# Patient Record
Sex: Male | Born: 1970 | Race: White | Hispanic: No | Marital: Single | Smoking: Former smoker
Health system: Southern US, Community
[De-identification: ages and names within clinical notes are randomized; demographics above are authoritative.]

## PROBLEM LIST (undated history)

## (undated) ENCOUNTER — Ambulatory Visit: Admission: EM | Payer: Self-pay

## (undated) DIAGNOSIS — Q539 Undescended testicle, unspecified: Secondary | ICD-10-CM

## (undated) HISTORY — DX: Undescended testicle, unspecified: Q53.9

## (undated) HISTORY — PX: TONSILLECTOMY: SUR1361

## (undated) HISTORY — PX: TESTICLE SURGERY: SHX794

## (undated) HISTORY — PX: WISDOM TOOTH EXTRACTION: SHX21

---

## 2006-10-21 ENCOUNTER — Ambulatory Visit: Payer: Self-pay | Admitting: Internal Medicine

## 2007-04-12 ENCOUNTER — Ambulatory Visit: Payer: Self-pay | Admitting: Urology

## 2007-05-21 ENCOUNTER — Ambulatory Visit: Payer: Self-pay | Admitting: Urology

## 2007-07-28 ENCOUNTER — Ambulatory Visit: Payer: Self-pay | Admitting: Family Medicine

## 2009-11-29 ENCOUNTER — Ambulatory Visit: Payer: Self-pay | Admitting: Internal Medicine

## 2011-02-07 HISTORY — PX: MASS EXCISION: SHX2000

## 2011-05-24 ENCOUNTER — Ambulatory Visit: Payer: Self-pay

## 2011-05-24 LAB — DOT URINE DIP
Blood: NEGATIVE
Glucose,UR: NEGATIVE mg/dL (ref 0–75)
Protein: NEGATIVE
Specific Gravity: 1.01 (ref 1.003–1.030)

## 2011-09-13 ENCOUNTER — Ambulatory Visit: Payer: Self-pay | Admitting: Family Medicine

## 2011-09-13 LAB — COMPREHENSIVE METABOLIC PANEL
Albumin: 4.4 g/dL (ref 3.4–5.0)
Alkaline Phosphatase: 77 U/L (ref 50–136)
BUN: 9 mg/dL (ref 7–18)
Bilirubin,Total: 0.9 mg/dL (ref 0.2–1.0)
Chloride: 103 mmol/L (ref 98–107)
Co2: 29 mmol/L (ref 21–32)
Creatinine: 1.03 mg/dL (ref 0.60–1.30)
EGFR (Non-African Amer.): >60
Osmolality: 278 (ref 275–301)
SGOT(AST): 23 U/L (ref 15–37)
SGPT (ALT): 45 U/L (ref 12–78)
Sodium: 140 mmol/L (ref 136–145)

## 2014-06-01 ENCOUNTER — Ambulatory Visit
Admit: 2014-06-01 | Disposition: A | Discharge status: Home / Self Care | Payer: Self-pay | Attending: Family Medicine | Admitting: Family Medicine

## 2016-07-31 ENCOUNTER — Other Ambulatory Visit: Payer: Self-pay

## 2016-08-01 ENCOUNTER — Ambulatory Visit (INDEPENDENT_AMBULATORY_CARE_PROVIDER_SITE_OTHER): Payer: BLUE CROSS/BLUE SHIELD | Admitting: Surgery

## 2016-08-01 ENCOUNTER — Encounter: Payer: Self-pay | Admitting: Surgery

## 2016-08-01 VITALS — BP 117/73 | HR 70 | Temp 98.7°F | Ht 64.0 in | Wt 123.0 lb

## 2016-08-01 DIAGNOSIS — D171 Benign lipomatous neoplasm of skin and subcutaneous tissue of trunk: Secondary | ICD-10-CM | POA: Diagnosis not present

## 2016-08-01 DIAGNOSIS — L720 Epidermal cyst: Secondary | ICD-10-CM

## 2016-08-01 NOTE — Patient Instructions (Signed)
Today we have seen you for your Lipoma on your Left Hip and also you have a sebaceous cyst on your right side. As long as they continue to not provide you pain or not bother you, you do not need to come back to see [place]. If you begin to have problems or either is causing you pain, please call our office and we will work you in with Dr. Rosana Hoes.   Lipoma A lipoma is a noncancerous (benign) tumor that is made up of fat cells. This is a very common type of soft-tissue growth. Lipomas are usually found under the skin (subcutaneous). They may occur in any tissue of the body that contains fat. Common areas for lipomas to appear include the back, shoulders, buttocks, and thighs. Lipomas grow slowly, and they are usually painless. Most lipomas do not cause problems and do not require treatment. What are the causes? The cause of this condition is not known. What increases the risk? This condition is more likely to develop in:  People who are 97-46 years old.  People who have a family history of lipomas.  What are the signs or symptoms? A lipoma usually appears as a small, round bump under the skin. It may feel soft or rubbery, but the firmness can vary. Most lipomas are not painful. However, a lipoma may become painful if it is located in an area where it pushes on nerves. How is this diagnosed? A lipoma can usually be diagnosed with a physical exam. You may also have tests to confirm the diagnosis and to rule out other conditions. Tests may include:  Imaging tests, such as a CT scan or MRI.  Removal of a tissue sample to be looked at under a microscope (biopsy).  How is this treated? Treatment is not needed for small lipomas that are not causing problems. If a lipoma continues to get bigger or it causes problems, removal is often the best option. Lipomas can also be removed to improve appearance. Removal of a lipoma is usually done with a surgery in which the fatty cells and the surrounding capsule are  removed. Most often, a medicine that numbs the area (local anesthetic) is used for this procedure. Follow these instructions at home:  Keep all follow-up visits as directed by your health care provider. This is important. Contact a health care provider if:  Your lipoma becomes larger or hard.  Your lipoma becomes painful, red, or increasingly swollen. These could be signs of infection or a more serious condition. This information is not intended to replace advice given to you by your health care provider. Make sure you discuss any questions you have with your health care provider. Document Released: 01/13/2002 Document Revised: 07/01/2015 Document Reviewed: 01/19/2014 Elsevier Interactive Patient Education  [employer].

## 2016-08-01 NOTE — Progress Notes (Signed)
Surgical Clinic History and Physical  Referring provider:  Verita Lamb, NP [address] [place], [place] [address]  HISTORY OF PRESENT ILLNESS (HPI):  46 y.o. otherwise healthy male presents for evaluation of Left groin "bulge". 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 He denies any pain associated with the Left groin mass; denies any recent constipation, increased coughing, or straining with urination; and denies fever/chills, CP, or SOB.  PAST MEDICAL HISTORY (PMH):  Past Medical History:  Diagnosis Date  . Undescended testicle      PAST SURGICAL HISTORY ([place]):  Past Surgical History:  Procedure Laterality Date  . MASS EXCISION  2013  . TESTICLE SURGERY Right   . WISDOM TOOTH EXTRACTION       MEDICATIONS:  Prior to Admission medications   Medication Sig Start Date End Date Taking? Authorizing Provider  clotrimazole (MYCELEX) 10 MG troche Take 10 mg by mouth. 07/21/16 08/04/16 Yes [provider]  Multiple Vitamin (MULTI-VITAMINS) TABS Take by mouth.   Yes [provider]     ALLERGIES:  No Known Allergies   SOCIAL HISTORY:  Social History   Social History  . Marital status: Single    Spouse name: N/A  . Number of children: N/A  . Years of education: N/A   Occupational History  . Not on file.   Social History Main Topics  . Smoking status: Former Smoker    Quit date: 08/01/2008  . Smokeless tobacco: Former Systems developer    Quit date: 08/02/1990  . Alcohol use Yes     Comment: 4 cans of beer weekly  . Drug use: No  . Sexual activity: Not on file   Other Topics Concern  . Not on file   Social History Narrative  . No narrative on file    The patient currently resides (home / rehab facility / nursing home): Home  The patient normally is (ambulatory / bedbound) : Ambulatory   FAMILY HISTORY:   Family History  Problem Relation Age of Onset  . Kidney disease Father   . Healthy Father   . Healthy Mother     Otherwise negative/non-contributory.  REVIEW OF SYSTEMS:  Constitutional: denies any other weight loss, fever, chills, or sweats  Eyes: denies any other vision changes, history of eye injury  ENT: denies sore throat, hearing problems  Respiratory: denies shortness of breath, wheezing  Cardiovascular: denies chest pain, palpitations  Gastrointestinal: abdominal pain, N/V, and bowel function as per HPI Musculoskeletal: denies any other joint pains or cramps  Skin: Denies any other rashes or skin discolorations  Neurological: denies any other headache, dizziness, weakness  Psychiatric: Denies any other depression, anxiety   All other review of systems were otherwise negative   VITAL SIGNS:  BP 117/73   Pulse 70   Temp 98.7 F (37.1 C) (Oral)   Ht 5\' 4"  (1.626 m)   Wt 123 lb (55.8 kg)   BMI 21.11 kg/m   PHYSICAL EXAM:  Constitutional:  -- Normal body habitus  -- Awake, alert, and oriented x3  Eyes:  -- Pupils equally round and reactive to light  -- No scleral icterus  Ear, nose, throat:  -- No jugular venous distension -- No nasal drainage, bleeding Pulmonary:  -- No crackles  -- Equal breath sounds bilaterally -- Breathing non-labored at rest Cardiovascular:  -- S1, S2 present  --  No pericardial rubs  Gastrointestinal:  -- Abdomen soft, nontender, nondistended, no guarding/rebound  -- No abdominal masses appreciated, pulsatile or otherwise Musculoskeletal and Integumentary:  -- Wounds or skin discoloration: non-tender mobile 3.5 cm x 2 cm Left ASIS spongy subcutaneous mass without surrounding erythema and a non-tender, firm, mobile Right lateral chest wall mass without erythema or drainage; no inguinal hernias appreciated -- Extremities: B/L UE and LE FROM, hands and feet warm, no edema  Neurologic:  -- Motor function: Intact and symmetric --  Sensation: Intact and symmetric  Labs:  CBC: No results found for: WBC, RBC BMP:  Lab Results  Component Value Date   GLUCOSE 88 09/13/2011   CO2 29 09/13/2011   BUN 9 09/13/2011   CREATININE 1.03 09/13/2011   CALCIUM 9.5 09/13/2011     Imaging studies: No pertinent imaging studies to review   Assessment/Plan:  46 y.o. male with Left hip/LLQ subcutaneous mass suggestive of lipoma and Right lateral chest wall firm subcutaneous mass suggestive of sebaceous/epidermoid cyst, complicated by co-morbidities including former tobacco abuse and Right orchiectomy.   - discussed with patient observation vs elective excision of likely lipoma and/or sebaceous cyst   - patient expresses wishes to continue non-operative management at this time, will call if becomes symptomatic  - return to clinic as needed, advised to call if any questions or concerns  All of the above recommendations were discussed with the patient, and all of patient's questions were answered to his expressed satisfaction.  Thank you for the opportunity to participate in this patient's care.  -- Marilynne Drivers Rosana Hoes, MD, [place]: [facility] - Partnering for exceptional care. Office: [phone]

## 2016-12-12 ENCOUNTER — Emergency Department
Admission: EM | Admit: 2016-12-12 | Discharge: 2016-12-12 | Disposition: A | Discharge status: Home / Self Care | Payer: Worker's Compensation | Attending: Emergency Medicine | Admitting: Emergency Medicine

## 2016-12-12 ENCOUNTER — Other Ambulatory Visit: Payer: Self-pay

## 2016-12-12 ENCOUNTER — Emergency Department: Payer: Worker's Compensation

## 2016-12-12 DIAGNOSIS — Y929 Unspecified place or not applicable: Secondary | ICD-10-CM | POA: Diagnosis not present

## 2016-12-12 DIAGNOSIS — Y9389 Activity, other specified: Secondary | ICD-10-CM | POA: Diagnosis not present

## 2016-12-12 DIAGNOSIS — Z23 Encounter for immunization: Secondary | ICD-10-CM | POA: Diagnosis not present

## 2016-12-12 DIAGNOSIS — Z87891 Personal history of nicotine dependence: Secondary | ICD-10-CM | POA: Insufficient documentation

## 2016-12-12 DIAGNOSIS — Y990 Civilian activity done for income or pay: Secondary | ICD-10-CM | POA: Insufficient documentation

## 2016-12-12 DIAGNOSIS — W228XXA Striking against or struck by other objects, initial encounter: Secondary | ICD-10-CM | POA: Diagnosis not present

## 2016-12-12 DIAGNOSIS — S0993XA Unspecified injury of face, initial encounter: Secondary | ICD-10-CM | POA: Diagnosis present

## 2016-12-12 DIAGNOSIS — S0181XA Laceration without foreign body of other part of head, initial encounter: Secondary | ICD-10-CM | POA: Diagnosis not present

## 2016-12-12 DIAGNOSIS — R6884 Jaw pain: Secondary | ICD-10-CM

## 2016-12-12 MED ORDER — TETANUS-DIPHTH-ACELL PERTUSSIS 5-2.5-18.5 LF-MCG/0.5 IM SUSP
0.5000 mL | Freq: Once | INTRAMUSCULAR | Status: AC
  Administered 2016-12-12: 0.5 mL via INTRAMUSCULAR

## 2016-12-12 MED ORDER — BACITRACIN ZINC 500 UNIT/GM EX OINT
1.0000 "application " | TOPICAL_OINTMENT | Freq: Two times a day (BID) | CUTANEOUS | Status: DC
  Administered 2016-12-12: 1 via TOPICAL
  Filled 2016-12-12: qty 0.9

## 2016-12-12 MED ORDER — TETANUS-DIPHTHERIA TOXOIDS TD 5-2 LFU IM INJ
0.5000 mL | INJECTION | Freq: Once | INTRAMUSCULAR | Status: DC
  Filled 2016-12-12: qty 0.5

## 2016-12-12 MED ORDER — LIDOCAINE HCL (PF) 1 % IJ SOLN
5.0000 mL | Freq: Once | INTRAMUSCULAR | Status: AC
  Administered 2016-12-12: 5 mL via INTRADERMAL
  Filled 2016-12-12: qty 5

## 2016-12-12 MED ORDER — TETANUS-DIPHTH-ACELL PERTUSSIS 5-2.5-18.5 LF-MCG/0.5 IM SUSP
INTRAMUSCULAR | Status: AC
  Administered 2016-12-12: 0.5 mL via INTRAMUSCULAR
  Filled 2016-12-12: qty 0.5

## 2016-12-12 NOTE — ED Provider Notes (Signed)
[facility] Emergency Department Provider Note  ____________________________________________  Time seen: Approximately 9:41 PM  I have reviewed the triage vital signs and the nursing notes.   HISTORY  Chief Complaint Laceration   HPI Edward Ibarra is a 46 y.o. male who presents to the emergency department for evaluation and treatment of the laceration that he sustained under the right mandible while at work. Patient states that he was lowering landing gear on his truck at work and the hand crank must have been wound tightly and when it loosened the crank spun around quickly and hit him under the chin. He denies falling to the ground or loss of consciousness. He does complain of some pain to the right side of the mandible, but denies dental injury/pain or pain with clenching teeth.  Past Medical History:  Diagnosis Date  . Undescended testicle     There are no active problems to display for this patient.   Past Surgical History:  Procedure Laterality Date  . MASS EXCISION  2013  . TESTICLE SURGERY Right   . WISDOM TOOTH EXTRACTION      Prior to Admission medications   Medication Sig Start Date End Date Taking? Authorizing Provider  Multiple Vitamin (MULTI-VITAMINS) TABS Take by mouth.    [provider]    Allergies Patient has no known allergies.  Family History  Problem Relation Age of Onset  . Kidney disease Father   . Healthy Father   . Healthy Mother     Social History Social History   Tobacco Use  . Smoking status: Former Smoker    Last attempt to quit: 08/01/2008    Years since quitting: 8.3  . Smokeless tobacco: Former Systems developer    Quit date: 08/02/1990  Substance Use Topics  . Alcohol use: Yes    Comment: 4 cans of beer weekly  . Drug use: No    Review of Systems  Constitutional: Negative for fever. Respiratory: Negative for cough or shortness of breath.  Musculoskeletal: Negative for myalgias. Positive for  pain in the right mandible. Skin: Positive for laceration to the right mandibular area Neurological: Negative for numbness or paresthesias. ____________________________________________   PHYSICAL EXAM:  VITAL SIGNS: ED Triage Vitals  Enc Vitals Group     BP 12/12/16 2136 140/84     Pulse Rate 12/12/16 2136 82     Resp 12/12/16 2136 20     Temp 12/12/16 2136 98.2 F (36.8 C)     Temp Source 12/12/16 2136 Oral     SpO2 12/12/16 2136 99 %     Weight 12/12/16 2134 120 lb (54.4 kg)     Height 12/12/16 2134 5\' 4"  (1.626 m)     Head Circumference --      Peak Flow --      Pain Score 12/12/16 2133 7     Pain Loc --      Pain Edu? --      Excl. in [place]? --      Constitutional: Well appearing. Eyes: Conjunctivae are clear without discharge or drainage. Nose: No rhinorrhea noted. Mouth/Throat: Airway is patent.  Neck: No stridor. Unrestricted range of motion observed.  Cardiovascular: Capillary refill is <3 seconds.  Respiratory: Respirations are even and unlabored.. Musculoskeletal: Unrestricted range of motion observed. Neurologic: Awake, alert, and oriented x 4.  Skin:  1 cm laceration to the skin over the right lower mandible  ____________________________________________   LABS (all labs ordered are listed, but only abnormal results are  displayed)  Labs Reviewed - No data to display ____________________________________________  EKG  Not indicated ____________________________________________  RADIOLOGY  Mandible images negative for acute bony abnormality per radiology. ____________________________________________   PROCEDURES  Procedure(s) performed: LACERATION REPAIR Performed by: Sherrie George  Consent: Verbal consent obtained.  Consent given by: patient  Prepped and Draped in normal sterile fashion  Wound explored: No foreign bodies identified or removed  Laceration Location: Skin overlying the right mandible  Laceration Length: 1 cm  Anesthesia:  Local   Local anesthetic: lidocaine 1 % without epinephrine  Anesthetic total: 3 ml  Irrigation method: syringe  Amount of cleaning: Standard   Skin closure: 6-0 Prolene   Number of sutures: 3   Technique: Simple interrupted   Patient tolerance: Patient tolerated the procedure well with no immediate complications.    ____________________________________________   INITIAL IMPRESSION / ASSESSMENT AND PLAN / ED COURSE  Edward Ibarra is a 46 y.o. male who presents to the emergency department for treatment and evaluation after sustaining an injury to his right mandible while at work. While in the emergency department tonight, and he was given a TD booster and sutures were inserted to repair the laceration. The patient was instructed to follow-up with his primary care provider or the place of his company's choice to have the sutures removed in 4-5 days. Wound care was discussed and written instructions were also provided upon discharge. Patient was advised to return to emergency department for any concern of infection if he is unable schedule an outpatient appointment.   Pertinent labs & imaging results that were available during my care of the patient were reviewed by me and considered in my medical decision making (see chart for details). ____________________________________________   FINAL CLINICAL IMPRESSION(S) / ED DIAGNOSES  Final diagnoses:  Laceration of skin of chin, initial encounter  Mandible pain    If controlled substance prescribed during this visit, 12 month history viewed on the [facility] prior to issuing an initial prescription for Schedule II or III opiod.   Note:  This document was prepared using Dragon voice recognition software and may include unintentional dictation errors.    Victorino Dike, FNP 12/13/16 0001    Eula Listen, MD 12/18/16 2151

## 2016-12-12 NOTE — ED Triage Notes (Signed)
Pt was at work and metal part hit him in the chin, small lac noted to chin area.

## 2016-12-12 NOTE — Discharge Instructions (Signed)
Do not get the sutured area wet for 24 hours. After 24 hours, shower/bathe as usual and pat the area dry. Change the bandage 2 times per day and apply antibiotic ointment. Leave open to air when at no risk of getting the area dirty, but cover at night before bed. See your PCP or the provider of your company's choice in  5 days for suture removal or sooner for signs or concern of infection.

## 2017-05-04 ENCOUNTER — Emergency Department: Payer: BLUE CROSS/BLUE SHIELD

## 2017-05-04 DIAGNOSIS — Z79899 Other long term (current) drug therapy: Secondary | ICD-10-CM | POA: Insufficient documentation

## 2017-05-04 DIAGNOSIS — R091 Pleurisy: Secondary | ICD-10-CM | POA: Diagnosis not present

## 2017-05-04 DIAGNOSIS — Z87891 Personal history of nicotine dependence: Secondary | ICD-10-CM | POA: Diagnosis not present

## 2017-05-04 DIAGNOSIS — R079 Chest pain, unspecified: Secondary | ICD-10-CM | POA: Diagnosis present

## 2017-05-04 NOTE — ED Triage Notes (Signed)
Patient c/o right chest pain radiating into right back that worsens with movement and deep inspiration

## 2017-05-04 NOTE — ED Notes (Signed)
Patient transported to X-ray 

## 2017-05-05 ENCOUNTER — Emergency Department
Admission: EM | Admit: 2017-05-05 | Discharge: 2017-05-05 | Disposition: A | Discharge status: Home / Self Care | Payer: BLUE CROSS/BLUE SHIELD | Attending: Emergency Medicine | Admitting: Emergency Medicine

## 2017-05-05 DIAGNOSIS — R091 Pleurisy: Secondary | ICD-10-CM

## 2017-05-05 LAB — BASIC METABOLIC PANEL
ANION GAP: 10 (ref 5–15)
BUN: 16 mg/dL (ref 6–20)
CALCIUM: 9.6 mg/dL (ref 8.9–10.3)
CO2: 25 mmol/L (ref 22–32)
Chloride: 105 mmol/L (ref 101–111)
Creatinine, Ser: 0.95 mg/dL (ref 0.61–1.24)
GFR calc Af Amer: >60 mL/min (ref >60)
GLUCOSE: 107 mg/dL — AB (ref 65–99)
POTASSIUM: 4.3 mmol/L (ref 3.5–5.1)
SODIUM: 140 mmol/L (ref 135–145)

## 2017-05-05 LAB — CBC
HEMATOCRIT: 43.1 % (ref 40.0–52.0)
HEMOGLOBIN: 14.9 g/dL (ref 13.0–18.0)
MCH: 29.9 pg (ref 26.0–34.0)
MCHC: 34.6 g/dL (ref 32.0–36.0)
MCV: 86.4 fL (ref 80.0–100.0)
Platelets: 225 10*3/uL (ref 150–440)
RBC: 4.99 MIL/uL (ref 4.40–5.90)
RDW: 13.2 % (ref 11.5–14.5)
WBC: 8.2 10*3/uL (ref 3.8–10.6)

## 2017-05-05 LAB — TROPONIN I: Troponin I: <0.03 ng/mL (ref <0.03)

## 2017-05-05 MED ORDER — KETOROLAC TROMETHAMINE 30 MG/ML IJ SOLN
30.0000 mg | Freq: Once | INTRAMUSCULAR | Status: AC
  Administered 2017-05-05: 30 mg via INTRAMUSCULAR
  Filled 2017-05-05: qty 1

## 2017-05-05 NOTE — ED Notes (Signed)
Patient sitting in lobby in no acute distress at this time.

## 2017-05-05 NOTE — ED Provider Notes (Addendum)
[facility] Emergency Department Provider Note  ____________________________________________   First MD Initiated Contact with Patient 05/05/17 0149     (approximate)  I have reviewed the triage vital signs and the nursing notes.   HISTORY  Chief Complaint Chest Pain   HPI Edward Ibarra is a 47 y.o. male without any chronic medical conditions was presenting with right-sided chest pain over the past 2-3 days.  He says the pain was constant and dull up until this afternoon when he sneezed and it became sharp.  The pain worsens with deep breathing as well as movement.  Patient denies any shortness of breath, nausea or vomiting.  Does not smoke or use any drugs.  Patient says that he drinks occasionally.  No history of heart disease in the family.  Has not tried any medication for pain relief.  He says that he is concerned about fumes or carbon monoxide causing the pain.  Says that he also is concerned about a virus because there is another coworker with similar symptoms.  Patient has pain also radiates to the right scapula.   Past Medical History:  Diagnosis Date  . Undescended testicle     There are no active problems to display for this patient.   Past Surgical History:  Procedure Laterality Date  . MASS EXCISION  2013  . TESTICLE SURGERY Right   . WISDOM TOOTH EXTRACTION      Prior to Admission medications   Medication Sig Start Date End Date Taking? Authorizing Provider  Multiple Vitamin (MULTI-VITAMINS) TABS Take by mouth.    [provider]    Allergies Patient has no known allergies.  Family History  Problem Relation Age of Onset  . Kidney disease Father   . Healthy Father   . Healthy Mother     Social History Social History   Tobacco Use  . Smoking status: Former Smoker    Last attempt to quit: 08/01/2008    Years since quitting: 8.7  . Smokeless tobacco: Former Systems developer    Quit date: 08/02/1990  Substance Use Topics   . Alcohol use: Yes    Comment: 4 cans of beer weekly  . Drug use: No    Review of Systems  Constitutional: No fever/chills Eyes: No visual changes. ENT: No sore throat. Cardiovascular: As above Respiratory: Denies shortness of breath. Gastrointestinal: No abdominal pain.  No nausea, no vomiting.  No diarrhea.  No constipation. Genitourinary: Negative for dysuria. Musculoskeletal: Negative for back pain. Skin: Negative for rash. Neurological: Negative for headaches, focal weakness or numbness.   ____________________________________________   PHYSICAL EXAM:  VITAL SIGNS: ED Triage Vitals  Enc Vitals Group     BP 05/04/17 2346 (!) 132/92     Pulse Rate 05/04/17 2346 67     Resp 05/04/17 2346 17     Temp 05/04/17 2346 98.6 F (37 C)     Temp Source 05/04/17 2346 Oral     SpO2 05/04/17 2346 100 %     Weight 05/04/17 2347 120 lb (54.4 kg)     Height --      Head Circumference --      Peak Flow --      Pain Score 05/04/17 2346 5     Pain Loc --      Pain Edu? --      Excl. in [place]? --     Constitutional: Alert and oriented. Well appearing and in no acute distress. Eyes: Conjunctivae are normal.  Head:  Atraumatic. Nose: No congestion/rhinnorhea. Mouth/Throat: Mucous membranes are moist.  Neck: No stridor.   Cardiovascular: Normal rate, regular rhythm. Grossly normal heart sounds.  Chest pain not reproducible to palpation. Respiratory: Normal respiratory effort.  No retractions. Lungs CTAB. Gastrointestinal: Soft and nontender. No distention. No CVA tenderness. Musculoskeletal: No lower extremity tenderness nor edema.  No joint effusions. Neurologic:  Normal speech and language. No gross focal neurologic deficits are appreciated. Skin:  Skin is warm, dry and intact. No rash noted. Psychiatric: Mood and affect are normal. Speech and behavior are normal.  ____________________________________________   LABS (all labs ordered are listed, but only abnormal results are  displayed)  Labs Reviewed  BASIC METABOLIC PANEL - Abnormal; Notable for the following components:      Result Value   Glucose, Bld 107 (*)    All other components within normal limits  CBC  TROPONIN I   ____________________________________________  EKG  ED ECG REPORT I, Doran Stabler, the attending physician, personally viewed and interpreted this ECG.   Date: 05/05/2017  EKG Time: 2341  Rate: 60  Rhythm: normal sinus rhythm  Axis: Normal  Intervals:none  ST&T Change: No ST segment elevation or depression.  No abnormal T wave inversion.  ____________________________________________  RADIOLOGY  Shadowing.  Recommend repeat chest x-ray ____________________________________________   PROCEDURES  Procedure(s) performed:   Procedures  Critical Care performed:   ____________________________________________   INITIAL IMPRESSION / ASSESSMENT AND PLAN / ED COURSE  Pertinent labs & imaging results that were available during my care of the patient were reviewed by me and considered in my medical decision making (see chart for details).  Differential diagnosis includes, but is not limited to, ACS, aortic dissection, pulmonary embolism, cardiac tamponade, pneumothorax, pneumonia, pericarditis, myocarditis, GI-related causes including esophagitis/gastritis, and musculoskeletal chest wall pain.   As part of my medical decision making, I reviewed the following data within the electronic MEDICAL RECORD NUMBER Notes from prior ED visits  PE RC negative.  Very atypical for cardiac pain.  Likely pleuritic chest wall pain.  Worsens with movement as well as deep breathing.  Patient has not tried any pain relief at home.  Will give Toradol.  Recommended ibuprofen as well as salve at home such as Aspercreme or icy hot.  No headache or weakness.  Very unlikely to be toxic inhalation as suspected by the patient or viral causes there are no other symptoms to be on the  pain. ____________________________________________   FINAL CLINICAL IMPRESSION(S) / ED DIAGNOSES  Pleurisy    NEW MEDICATIONS STARTED DURING THIS VISIT:  New Prescriptions   No medications on file     Note:  This document was prepared using Dragon voice recognition software and may include unintentional dictation errors.     Orbie Pyo, MD 05/05/17 [phone]  I have discussed the possible pulmonary nodule with the patient versus shadowing and he knows that he must have a repeat chest x-ray done in 2-3 weeks.  He was given follow-up with his primary care doctor.    Orbie Pyo, MD 05/05/17 [phone]

## 2018-01-04 ENCOUNTER — Other Ambulatory Visit: Payer: Self-pay

## 2018-01-04 ENCOUNTER — Ambulatory Visit
Admission: EM | Admit: 2018-01-04 | Discharge: 2018-01-04 | Disposition: A | Discharge status: Home / Self Care | Payer: BLUE CROSS/BLUE SHIELD | Attending: Family Medicine | Admitting: Family Medicine

## 2018-01-04 ENCOUNTER — Encounter: Payer: Self-pay | Admitting: Emergency Medicine

## 2018-01-04 DIAGNOSIS — S60351A Superficial foreign body of right thumb, initial encounter: Secondary | ICD-10-CM | POA: Diagnosis not present

## 2018-01-04 DIAGNOSIS — M79644 Pain in right finger(s): Secondary | ICD-10-CM

## 2018-01-04 DIAGNOSIS — T148XXA Other injury of unspecified body region, initial encounter: Secondary | ICD-10-CM

## 2018-01-04 NOTE — ED Provider Notes (Signed)
[facility]    CSN: [id number] Arrival date & time: 01/04/18  1416  History   Chief Complaint Chief Complaint  Patient presents with  . Foreign Body in Skin    right thumb   HPI   47 year old male presents with a splinter in his right thumb.  Patient reports that he got a splinter in his right thumb approximately 5 days ago.  He has tried to get it out but has not been able to do so.  Mild pain.  No redness or drainage.  No medications taken.  No other reported symptoms.  No other complaints.  Patient essentially would like me toremove his splinter today.  PMH, Surgical Hx, Family Hx, Social History reviewed and updated as below.  Past Medical History:  Diagnosis Date  . Undescended testicle    Past Surgical History:  Procedure Laterality Date  . MASS EXCISION  2013  . TESTICLE SURGERY Right   . WISDOM TOOTH EXTRACTION     Home Medications    Prior to Admission medications   Medication Sig Start Date End Date Taking? Authorizing Provider  Multiple Vitamin (MULTI-VITAMINS) TABS Take by mouth.   Yes [provider]   Family History Family History  Problem Relation Age of Onset  . Kidney disease Father   . Healthy Father   . Healthy Mother    Social History Social History   Tobacco Use  . Smoking status: Former Smoker    Last attempt to quit: 08/01/2008    Years since quitting: 9.4  . Smokeless tobacco: Former Systems developer    Quit date: 08/02/1990  Substance Use Topics  . Alcohol use: Yes    Comment: 4 cans of beer weekly  . Drug use: No   Allergies   Patient has no known allergies.  Review of Systems Review of Systems  Constitutional: Negative.   Skin:       Foreign body, right thumb   Physical Exam Triage Vital Signs ED Triage Vitals  Enc Vitals Group     BP 01/04/18 1428 114/77     Pulse Rate 01/04/18 1428 62     Resp 01/04/18 1428 16     Temp 01/04/18 1428 98 F (36.7 C)     Temp Source 01/04/18 1428 Oral     SpO2 01/04/18 1428  100 %     Weight 01/04/18 1426 120 lb (54.4 kg)     Height 01/04/18 1426 5\' 4"  (1.626 m)     Head Circumference --      Peak Flow --      Pain Score 01/04/18 1426 2     Pain Loc --      Pain Edu? --      Excl. in [place]? --    Updated Vital Signs BP 114/77 (BP Location: Left Arm)   Pulse 62   Temp 98 F (36.7 C) (Oral)   Resp 16   Ht 5\' 4"  (1.626 m)   Wt 54.4 kg   SpO2 100%   BMI 20.60 kg/m   Visual Acuity Right Eye Distance:   Left Eye Distance:   Bilateral Distance:    Right Eye Near:   Left Eye Near:    Bilateral Near:     Physical Exam  Constitutional: He is oriented to person, place, and time. He appears well-developed. No distress.  HENT:  Head: Normocephalic and atraumatic.  Pulmonary/Chest: Effort normal. No respiratory distress.  Neurological: He is alert and oriented to person, place,  and time.  Skin:  Right thumb -dark foreign body noted just underneath the skin.  No surrounding erythema.  Psychiatric: He has a normal mood and affect. His behavior is normal.  Nursing note and vitals reviewed.  UC Treatments / Results  Labs (all labs ordered are listed, but only abnormal results are displayed) Labs Reviewed - No data to display  EKG None  Radiology No results found.  Procedures Procedures (including critical care time) Foreign body removal  Less than 1 cc of lidocaine was used to anesthetize the area after cleaning with alcohol.  Small incision made foreign body easily removed clear forceps.  Bleeding was minimal and well controlled.  Wound was dressed with Telfa and Coban.  Medications Ordered in UC Medications - No data to display  Initial Impression / Assessment and Plan / UC Course  I have reviewed the triage vital signs and the nursing notes.  Pertinent labs & imaging results that were available during my care of the patient were reviewed by me and considered in my medical decision making (see chart for details).    47 year old male  presents with a foreign body in his left thumb.  Easily removed today.  Supportive care.  Final Clinical Impressions(s) / UC Diagnoses   Final diagnoses:  Splinter in skin     Discharge Instructions     Soap and water.    Antibiotic ointment once or twice daily.  Take care  Dr. Lacinda Axon    ED Prescriptions    None     Controlled Substance Prescriptions [place] Controlled Substance Registry consulted? Not Applicable   Coral Spikes, DO 01/04/18 2103

## 2018-01-04 NOTE — ED Triage Notes (Signed)
Patient states that he has had splinter stuck in his right thumb for the past 4-5 days.

## 2018-01-04 NOTE — Discharge Instructions (Signed)
Soap and water.    Antibiotic ointment once or twice daily.  Take care  Dr. Lacinda Axon

## 2019-03-17 IMAGING — CR DG MANDIBLE 1-3V
1 series · 4 of 4 positions shown · non-contrast
Comparison: None.

CLINICAL DATA: Pain after injury

EXAM:
MANDIBLE - 1-3 VIEW

[Series 1: dg mandible 1-3 views · 0.14mm/px · 4 of 4 slices shown]
[im 1/4]
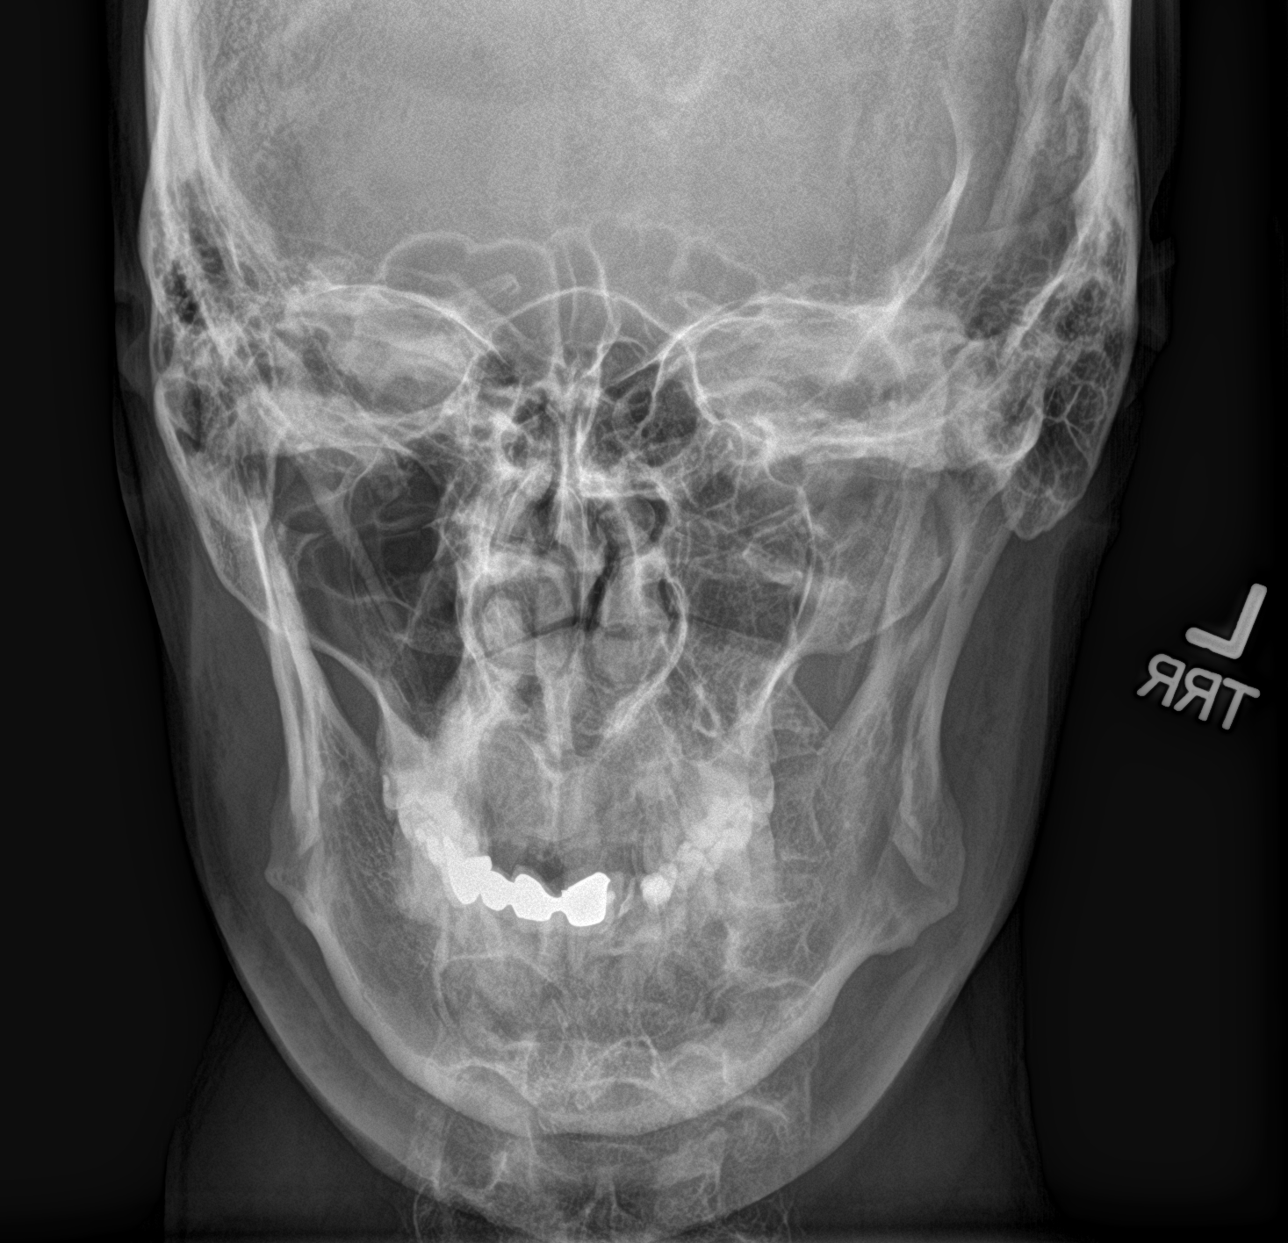
[im 2/4]
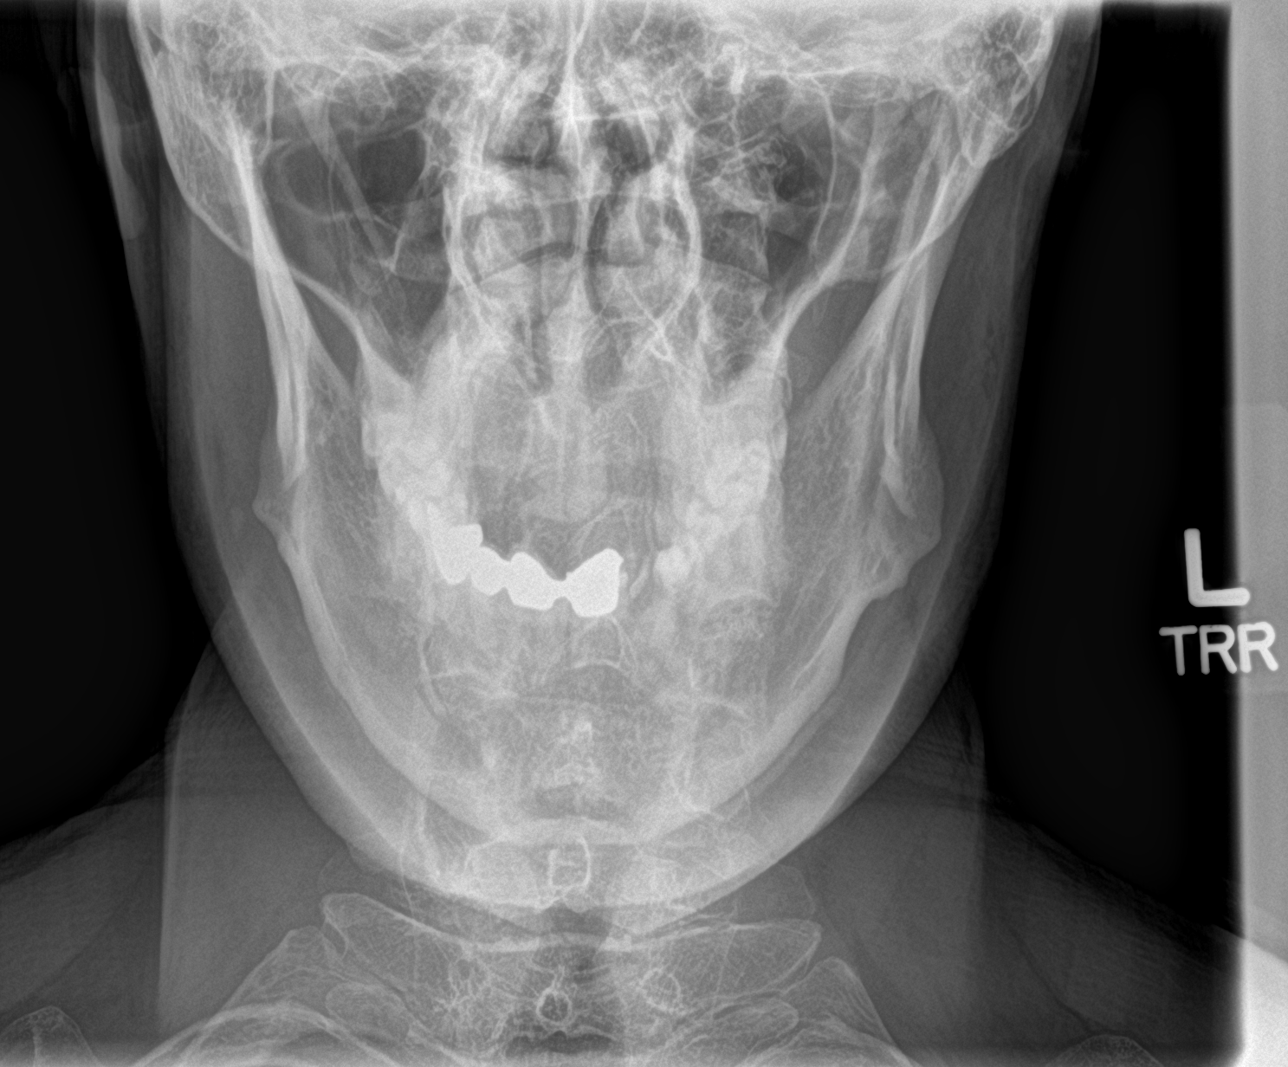
[im 3/4]
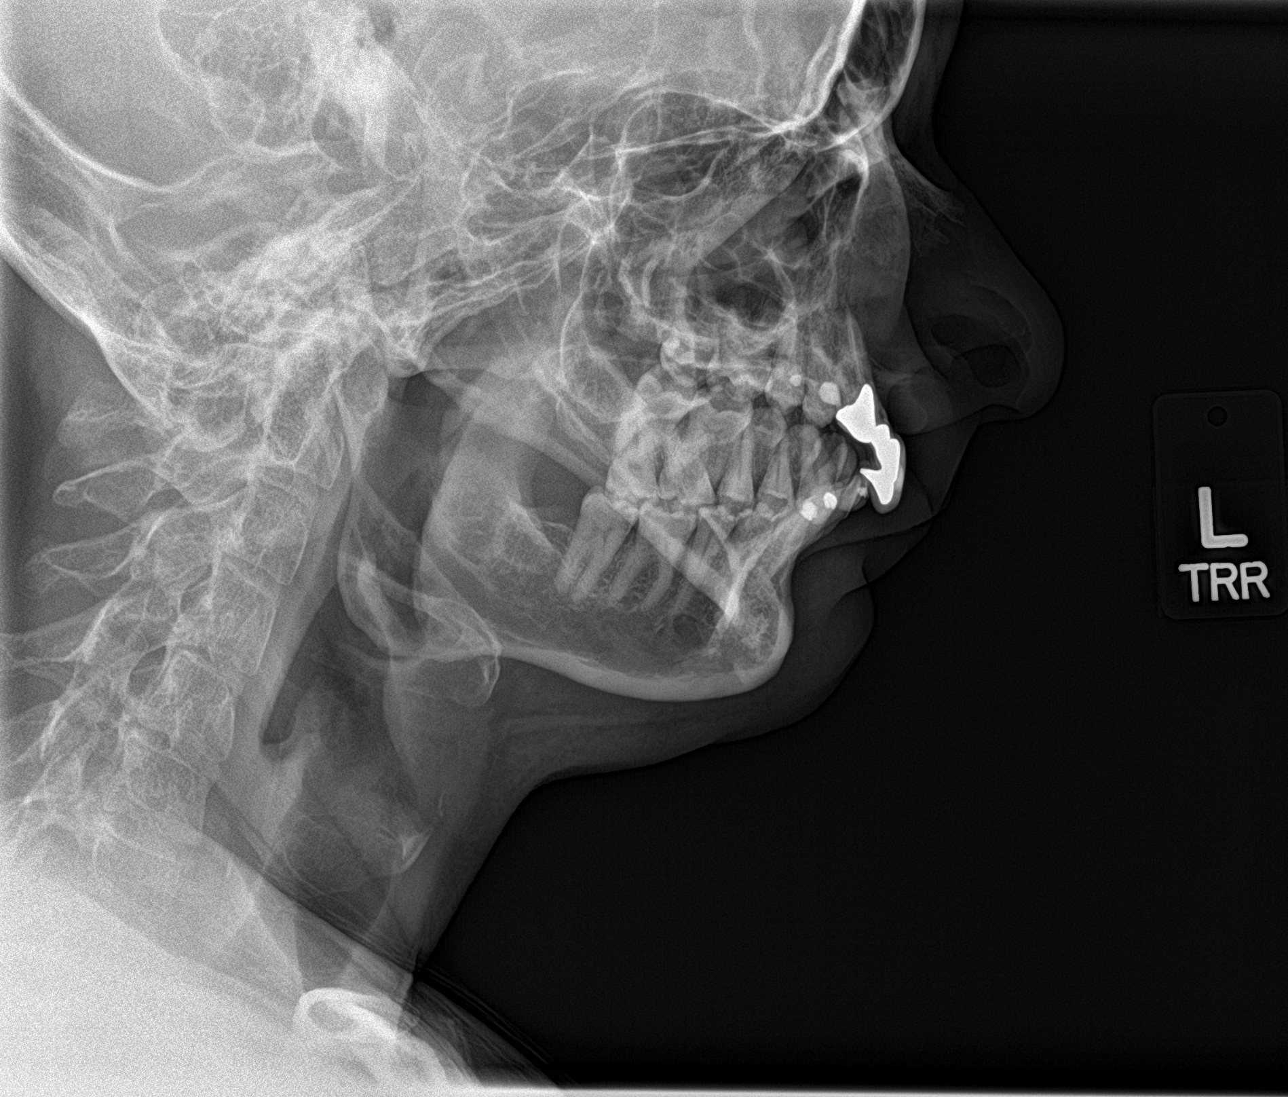
[im 4/4]
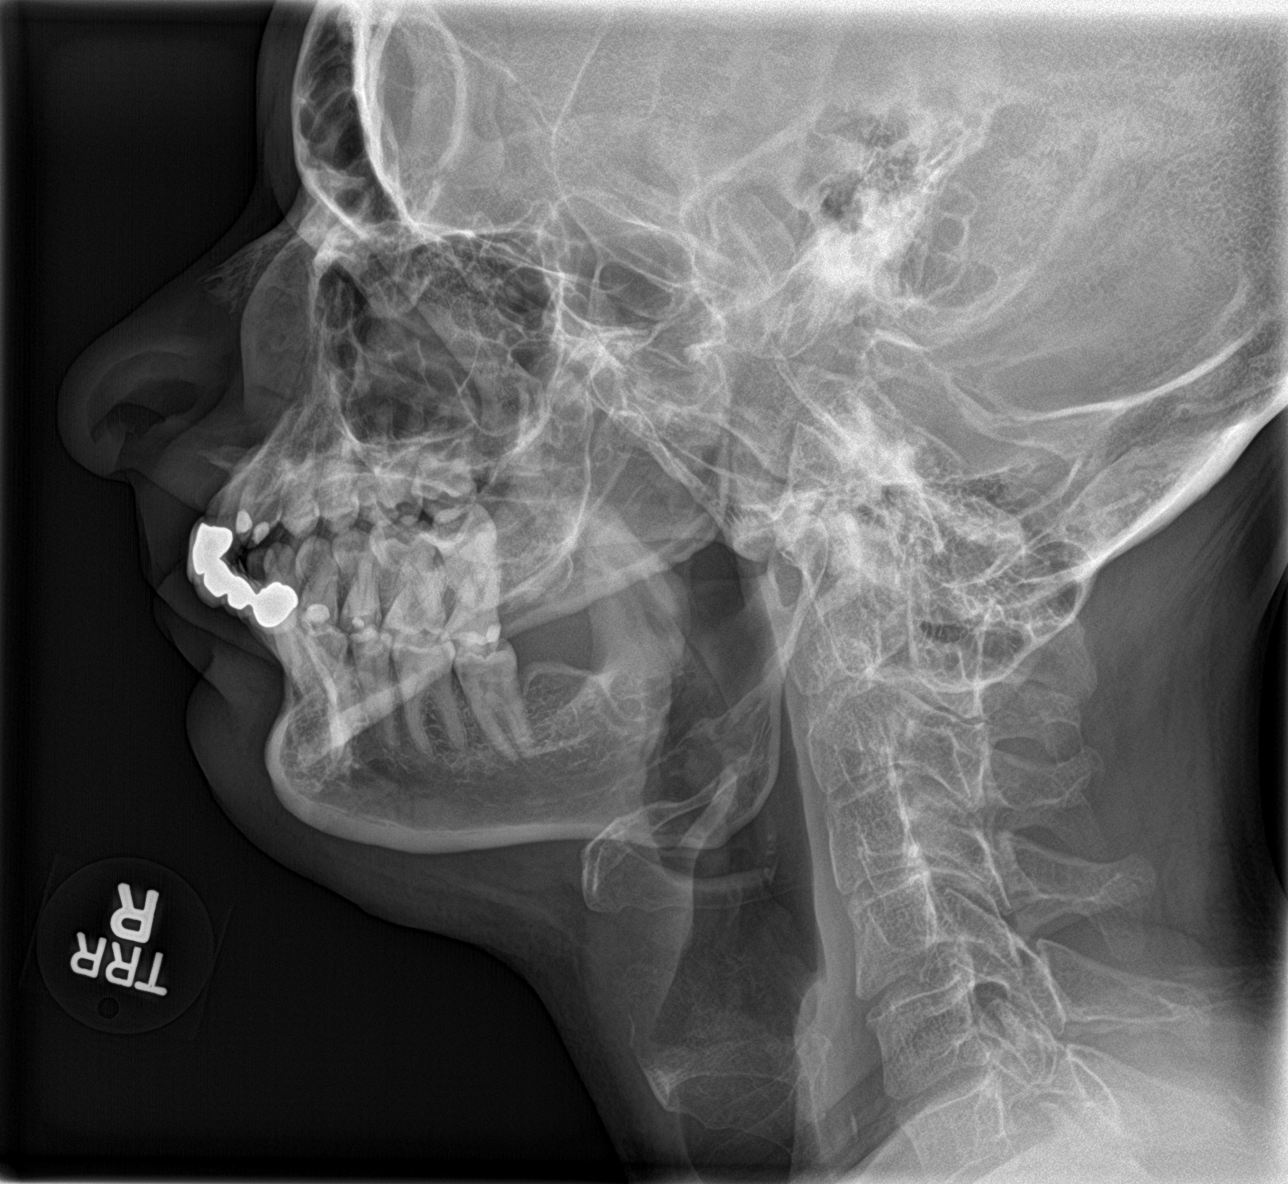

[4 of 4 positions shown; findings below may reference images not displayed]

FINDINGS: There is no evidence of fracture or other focal bone lesions.
IMPRESSION: Negative.

## 2020-06-19 ENCOUNTER — Ambulatory Visit
Admission: EM | Admit: 2020-06-19 | Discharge: 2020-06-19 | Disposition: A | Discharge status: Home / Self Care | Payer: BLUE CROSS/BLUE SHIELD | Attending: Emergency Medicine | Admitting: Emergency Medicine

## 2020-06-19 ENCOUNTER — Encounter: Payer: Self-pay | Admitting: Emergency Medicine

## 2020-06-19 ENCOUNTER — Other Ambulatory Visit: Payer: Self-pay

## 2020-06-19 DIAGNOSIS — L729 Follicular cyst of the skin and subcutaneous tissue, unspecified: Secondary | ICD-10-CM

## 2020-06-19 DIAGNOSIS — L723 Sebaceous cyst: Secondary | ICD-10-CM

## 2020-06-19 DIAGNOSIS — L089 Local infection of the skin and subcutaneous tissue, unspecified: Secondary | ICD-10-CM

## 2020-06-19 MED ORDER — IBUPROFEN 800 MG PO TABS: 800.0000 mg | ORAL_TABLET | Freq: Three times a day (TID) | ORAL | 0 refills | Status: AC

## 2020-06-19 MED ORDER — CEPHALEXIN 500 MG PO CAPS: 500.0000 mg | ORAL_CAPSULE | Freq: Three times a day (TID) | ORAL | 0 refills | Status: AC

## 2020-06-19 NOTE — ED Provider Notes (Signed)
[facility]    CSN: [id number] Arrival date & time: 06/19/20  1102      History   Chief Complaint Chief Complaint  Patient presents with  . Cyst    Right chest    HPI Edward Ibarra is a 50 y.o. male.   Edward Ibarra presents with complaints of painful red and swollen cyst to right chest. He has had this for years, and it has recurred. Has had to have it drained in the past. Increased pain over the past few days. No fevers. No active drainage.     ROS per HPI, negative if not otherwise mentioned.      Past Medical History:  Diagnosis Date  . Undescended testicle     There are no problems to display for this patient.   Past Surgical History:  Procedure Laterality Date  . MASS EXCISION  2013  . TESTICLE SURGERY Right   . TONSILLECTOMY    . WISDOM TOOTH EXTRACTION         Home Medications    Prior to Admission medications   Medication Sig Start Date End Date Taking? Authorizing Provider  cephALEXin (KEFLEX) 500 MG capsule Take 1 capsule (500 mg total) by mouth 3 (three) times daily for 7 days. 06/19/20 06/26/20 Yes Kitara Hebb, Lanelle Bal B, NP  ibuprofen (ADVIL) 800 MG tablet Take 1 tablet (800 mg total) by mouth 3 (three) times daily. 06/19/20  Yes Zigmund Gottron, NP  Multiple Vitamin (MULTI-VITAMINS) TABS Take by mouth.    [provider]    Family History Family History  Problem Relation Age of Onset  . Kidney disease Father   . Other Mother        unknown medical history    Social History Social History   Tobacco Use  . Smoking status: Former Smoker    Quit date: 08/01/2008    Years since quitting: 11.8  . Smokeless tobacco: Former Systems developer    Quit date: 08/02/1990  Vaping Use  . Vaping Use: Never used  Substance Use Topics  . Alcohol use: Yes    Comment: 4 cans of beer weekly  . Drug use: No     Allergies   Patient has no known allergies.   Review of Systems Review of Systems   Physical Exam Triage  Vital Signs ED Triage Vitals  Enc Vitals Group     BP 06/19/20 1121 127/88     Pulse Rate 06/19/20 1121 79     Resp 06/19/20 1121 18     Temp 06/19/20 1121 99.1 F (37.3 C)     Temp Source 06/19/20 1121 Oral     SpO2 06/19/20 1121 99 %     Weight 06/19/20 1122 150 lb (68 kg)     Height 06/19/20 1122 5\' 4"  (1.626 m)     Head Circumference --      Peak Flow --      Pain Score 06/19/20 1121 5     Pain Loc --      Pain Edu? --      Excl. in [place]? --    No data found.  Updated Vital Signs BP 127/88 (BP Location: Left Arm)   Pulse 79   Temp 99.1 F (37.3 C) (Oral)   Resp 18   Ht 5\' 4"  (1.626 m)   Wt 150 lb (68 kg)   SpO2 99%   BMI 25.75 kg/m   Visual Acuity Right Eye Distance:   Left Eye Distance:  Bilateral Distance:    Right Eye Near:   Left Eye Near:    Bilateral Near:     Physical Exam Constitutional:      Appearance: He is well-developed.  Cardiovascular:     Rate and Rhythm: Normal rate.  Pulmonary:     Effort: Pulmonary effort is normal.  Chest:       Comments: Right lateral breast with a 2 cm cystic structure, fluctuant, with surrounding redness; tender; no active drainage  Skin:    General: Skin is warm and dry.  Neurological:     Mental Status: He is alert and oriented to person, place, and time.      UC Treatments / Results  Labs (all labs ordered are listed, but only abnormal results are displayed) Labs Reviewed - No data to display  EKG   Radiology No results found.  Procedures Incision and Drainage  Date/Time: 06/19/2020 12:55 PM Performed by: Zigmund Gottron, NP Authorized by: Zigmund Gottron, NP   Consent:    Consent obtained:  Verbal   Consent given by:  Patient   Risks discussed:  Bleeding, pain, infection and incomplete drainage   Alternatives discussed:  Observation and referral Universal protocol:    Procedure explained and questions answered to patient or proxy's satisfaction: yes     Patient identity confirmed:   Verbally with patient Location:    Type:  Cyst   Size:  2   Location:  Trunk   Trunk location:  R breast Pre-procedure details:    Skin preparation:  Povidone-iodine Sedation:    Sedation type:  None Anesthesia:    Anesthesia method:  Topical application   Topical anesthesia: freeze spray. Procedure details:    Incision types:  Stab incision   Drainage amount:  Moderate   Wound treatment:  Wound left open   Packing materials:  None Post-procedure details:    Procedure completion:  Tolerated Comments:     Superficial cyst, opted to use freeze spray for superficial anesthesia; drainage material was very thick, therefore required manual expression for removal; unfortunately this was painful so patient unable to tolerate for complete drainage; given cystic nature of this, did not pursue further drainage at this time   (including critical care time)  Medications Ordered in UC Medications - No data to display  Initial Impression / Assessment and Plan / UC Course  I have reviewed the triage vital signs and the nursing notes.  Pertinent labs & imaging results that were available during my care of the patient were reviewed by me and considered in my medical decision making (see chart for details).     Cyst which appears infected. Incision with incomplete drainage. I feel the antibiotics are going to provide more relief than the drainage given it's recurrence and presence for years, so did not pursue further drainage. Encouraged follow up for removal as needed in the future. Return precautions provided. Patient verbalized understanding and agreeable to plan.   Final Clinical Impressions(s) / UC Diagnoses   Final diagnoses:  Infected cyst of skin  Sebaceous cyst     Discharge Instructions     This appears consistent with a cyst, which is why it has recurred, unfortunately. It does appear infected today.  Complete course of antibiotics.  Apply warm compresses to promote  additional drainage from it to decompress.  Ibuprofen as needed for pain.  Keep covered to keep clean until incision heals.  Return if no improvement or if worsening.  Follow up with  dermatology or general surgery for complete removal in the future, if interested.    ED Prescriptions    Medication Sig Dispense Auth. Provider   cephALEXin (KEFLEX) 500 MG capsule Take 1 capsule (500 mg total) by mouth 3 (three) times daily for 7 days. 21 capsule Augusto Gamble B, NP   ibuprofen (ADVIL) 800 MG tablet Take 1 tablet (800 mg total) by mouth 3 (three) times daily. 30 tablet Zigmund Gottron, NP     PDMP not reviewed this encounter.   Zigmund Gottron, NP 06/19/20 1258

## 2020-06-19 NOTE — Discharge Instructions (Addendum)
This appears consistent with a cyst, which is why it has recurred, unfortunately. It does appear infected today.  Complete course of antibiotics.  Apply warm compresses to promote additional drainage from it to decompress.  Ibuprofen as needed for pain.  Keep covered to keep clean until incision heals.  Return if no improvement or if worsening.  Follow up with dermatology or general surgery for complete removal in the future, if interested.

## 2020-06-19 NOTE — ED Triage Notes (Signed)
Patient in today c/o cyst on his right upper chest x 2 days. Patient states he has had this area drained several times over the last 2 years.

## 2020-06-21 ENCOUNTER — Other Ambulatory Visit: Payer: Self-pay

## 2020-06-21 ENCOUNTER — Ambulatory Visit
Admission: RE | Admit: 2020-06-21 | Discharge: 2020-06-21 | Disposition: A | Discharge status: Home / Self Care | Payer: BLUE CROSS/BLUE SHIELD | Source: Ambulatory Visit | Attending: Family Medicine | Admitting: Family Medicine

## 2020-06-21 VITALS — BP 120/79 | HR 79 | Temp 98.2°F | Resp 18 | Ht 64.0 in | Wt 150.0 lb

## 2020-06-21 DIAGNOSIS — L723 Sebaceous cyst: Secondary | ICD-10-CM

## 2020-06-21 DIAGNOSIS — L089 Local infection of the skin and subcutaneous tissue, unspecified: Secondary | ICD-10-CM

## 2020-06-21 NOTE — ED Triage Notes (Signed)
Pt c/o continued issues with an abscess he had drained on Saturday. Pt reports the area is still red, oozing and painful. Pt has started the abx he was given. Pt denies f/n/v/d or other symptoms.

## 2020-06-21 NOTE — Discharge Instructions (Addendum)
Apply warm compresses continuously to the area that is draining to help facilitate drainage of any more retained pus.  Continue the ibuprofen you are previously prescribed as needed for pain as well as finish up the antibiotics.  If you continue to have swelling and drainage to the area return for reevaluation or go to the emergency department.

## 2020-06-21 NOTE — ED Provider Notes (Signed)
[facility]    CSN: [id number] Arrival date & time: 06/21/20  1546      History   Chief Complaint Chief Complaint  Patient presents with  . Abscess    Right axilla    HPI Edward Ibarra is a 50 y.o. male.   HPI   50 year old male here for reevaluation of right axillary abscess.  Patient reports that he was seen in this urgent care 2 days ago and had an abscess drained in his right axilla.  He reports that there was not a significant amount of drainage at the time of the I&D but that it has continued to be very painful and draining a thick, foul-smelling drainage from the open tract.    Past Medical History:  Diagnosis Date  . Undescended testicle     There are no problems to display for this patient.   Past Surgical History:  Procedure Laterality Date  . MASS EXCISION  2013  . TESTICLE SURGERY Right   . TONSILLECTOMY    . WISDOM TOOTH EXTRACTION         Home Medications    Prior to Admission medications   Medication Sig Start Date End Date Taking? Authorizing Provider  cephALEXin (KEFLEX) 500 MG capsule Take 1 capsule (500 mg total) by mouth 3 (three) times daily for 7 days. 06/19/20 06/26/20 Yes Burky, Lanelle Bal B, NP  ibuprofen (ADVIL) 800 MG tablet Take 1 tablet (800 mg total) by mouth 3 (three) times daily. 06/19/20  Yes Zigmund Gottron, NP  Multiple Vitamin (MULTI-VITAMINS) TABS Take by mouth.   Yes [provider]    Family History Family History  Problem Relation Age of Onset  . Kidney disease Father   . Other Mother        unknown medical history    Social History Social History   Tobacco Use  . Smoking status: Former Smoker    Quit date: 08/01/2008    Years since quitting: 11.8  . Smokeless tobacco: Former Systems developer    Quit date: 08/02/1990  Vaping Use  . Vaping Use: Never used  Substance Use Topics  . Alcohol use: Yes    Comment: 4 cans of beer weekly  . Drug use: No     Allergies   Patient has no known  allergies.   Review of Systems Review of Systems  Constitutional: Negative for fever.  Skin: Positive for color change and wound.  Psychiatric/Behavioral: Negative.      Physical Exam Triage Vital Signs ED Triage Vitals  Enc Vitals Group     BP 06/21/20 1619 120/79     Pulse Rate 06/21/20 1619 79     Resp 06/21/20 1619 18     Temp 06/21/20 1619 98.2 F (36.8 C)     Temp Source 06/21/20 1619 Oral     SpO2 06/21/20 1619 98 %     Weight 06/21/20 1618 150 lb (68 kg)     Height 06/21/20 1618 5\' 4"  (1.626 m)     Head Circumference --      Peak Flow --      Pain Score 06/21/20 1617 5     Pain Loc --      Pain Edu? --      Excl. in [place]? --    No data found.  Updated Vital Signs BP 120/79 (BP Location: Left Arm)   Pulse 79   Temp 98.2 F (36.8 C) (Oral)   Resp 18   Ht 5\' 4"  (  1.626 m)   Wt 150 lb (68 kg)   SpO2 98%   BMI 25.75 kg/m   Visual Acuity Right Eye Distance:   Left Eye Distance:   Bilateral Distance:    Right Eye Near:   Left Eye Near:    Bilateral Near:     Physical Exam Vitals and nursing note reviewed.  Constitutional:      General: He is not in acute distress.    Appearance: Normal appearance. He is normal weight. He is not ill-appearing.  HENT:     Head: Normocephalic and atraumatic.  Skin:    General: Skin is warm and dry.     Capillary Refill: Capillary refill takes less than 2 seconds.     Findings: Erythema present.  Neurological:     General: No focal deficit present.     Mental Status: He is alert and oriented to person, place, and time.  Psychiatric:        Mood and Affect: Mood normal.        Behavior: Behavior normal.        Thought Content: Thought content normal.        Judgment: Judgment normal.      UC Treatments / Results  Labs (all labs ordered are listed, but only abnormal results are displayed) Labs Reviewed - No data to display  EKG   Radiology No results found.  Procedures Procedures (including critical  care time)  Medications Ordered in UC Medications - No data to display  Initial Impression / Assessment and Plan / UC Course  I have reviewed the triage vital signs and the nursing notes.  Pertinent labs & imaging results that were available during my care of the patient were reviewed by me and considered in my medical decision making (see chart for details).   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  He reports that he has been taking ibuprofen and the antibiotic that was prescribed but there continues to be a significant amount of pain and redness at the site he wanted to be reevaluated.  He is also concerned about the thick, foul-smelling drainage from the open tract.  Physical exam reveals a 1 cm incision in the lower aspect of the right axilla with surrounding erythema and thick, malodorous, tan drainage from the wound.  The area is tender but is free of induration.  Gentle palpation around the open tract facilitated the expulsion of approximately 20 mL of thick, malodorous, pus.  Patient was only able to tolerate limited palpation and probing of the wound tract.  A new dressing was applied with 4 x 4 gauze and tape.  Patient advised to apply warm compresses to the area to facilitate drainage of any remaining pus, continue the ibuprofen for pain, and to finish his antibiotics.   Final Clinical Impressions(s) / UC Diagnoses   Final diagnoses:  Infected sebaceous cyst of skin     Discharge Instructions     Apply warm compresses continuously to the area that is draining to help facilitate drainage of any more retained pus.  Continue the ibuprofen you are previously prescribed as needed for pain as well as finish up the antibiotics.  If you continue to have swelling and drainage to the area return for reevaluation or go to the emergency department.    ED  Prescriptions  None     PDMP not reviewed this encounter.   Margarette Canada, NP 06/21/20 1709

## 2021-03-31 ENCOUNTER — Ambulatory Visit
Admission: EM | Admit: 2021-03-31 | Discharge: 2021-03-31 | Disposition: A | Discharge status: Home / Self Care | Payer: Self-pay | Attending: Emergency Medicine | Admitting: Emergency Medicine

## 2021-03-31 ENCOUNTER — Other Ambulatory Visit: Payer: Self-pay

## 2021-03-31 ENCOUNTER — Encounter: Payer: Self-pay | Admitting: Emergency Medicine

## 2021-03-31 DIAGNOSIS — S51011A Laceration without foreign body of right elbow, initial encounter: Secondary | ICD-10-CM

## 2021-03-31 DIAGNOSIS — Z23 Encounter for immunization: Secondary | ICD-10-CM

## 2021-03-31 MED ORDER — TETANUS-DIPHTH-ACELL PERTUSSIS 5-2.5-18.5 LF-MCG/0.5 IM SUSY
0.5000 mL | PREFILLED_SYRINGE | Freq: Once | INTRAMUSCULAR | Status: AC
  Administered 2021-03-31: 0.5 mL via INTRAMUSCULAR

## 2021-03-31 NOTE — ED Provider Notes (Addendum)
[facility]    CSN: [id number] Arrival date & time: 03/31/21  1458      History   Chief Complaint Chief Complaint  Patient presents with   Laceration    HPI Edward Ibarra is a 51 y.o. male.   Patient presents with a laceration to the right elbow beginning 1 hour ago.  Endorses that he was working on a car when the windshield shattered scraping his arm.  Bleeding has subsided.  Attempted to clean.  Last tetanus shot 2018.  Range of motion intact.  Denies numbness, tingling.  Past Medical History:  Diagnosis Date   Undescended testicle     There are no problems to display for this patient.   Past Surgical History:  Procedure Laterality Date   MASS EXCISION  2013   TESTICLE SURGERY Right    TONSILLECTOMY     WISDOM TOOTH EXTRACTION         Home Medications    Prior to Admission medications   Medication Sig Start Date End Date Taking? Authorizing Provider  ibuprofen (ADVIL) 800 MG tablet Take 1 tablet (800 mg total) by mouth 3 (three) times daily. 06/19/20   Zigmund Gottron, NP  Multiple Vitamin (MULTI-VITAMINS) TABS Take by mouth.    [provider]    Family History Family History  Problem Relation Age of Onset   Kidney disease Father    Other Mother        unknown medical history    Social History Social History   Tobacco Use   Smoking status: Former    Types: Cigarettes    Quit date: 08/01/2008    Years since quitting: 12.6   Smokeless tobacco: Former    Quit date: 08/02/1990  Vaping Use   Vaping Use: Never used  Substance Use Topics   Alcohol use: Yes    Comment: 4 cans of beer weekly   Drug use: No     Allergies   Patient has no known allergies.   Review of Systems Review of Systems  Constitutional: Negative.   Respiratory: Negative.    Cardiovascular: Negative.   Skin:  Positive for wound. Negative for color change, pallor and rash.  Neurological: Negative.     Physical Exam Triage Vital Signs ED  Triage Vitals  Enc Vitals Group     BP 03/31/21 1522 (!) 141/95     Pulse Rate 03/31/21 1522 82     Resp 03/31/21 1522 18     Temp 03/31/21 1522 98.3 F (36.8 C)     Temp Source 03/31/21 1522 Oral     SpO2 03/31/21 1522 99 %     Weight 03/31/21 1521 149 lb 14.6 oz (68 kg)     Height 03/31/21 1521 5\' 4"  (1.626 m)     Head Circumference --      Peak Flow --      Pain Score 03/31/21 1520 2     Pain Loc --      Pain Edu? --      Excl. in [place]? --    No data found.  Updated Vital Signs BP (!) 141/95 (BP Location: Left Arm)    Pulse 82    Temp 98.3 F (36.8 C) (Oral)    Resp 18    Ht 5\' 4"  (1.626 m)    Wt 149 lb 14.6 oz (68 kg)    SpO2 99%    BMI 25.73 kg/m   Visual Acuity Right Eye Distance:   Left  Eye Distance:   Bilateral Distance:    Right Eye Near:   Left Eye Near:    Bilateral Near:     Physical Exam Constitutional:      Appearance: Normal appearance.  HENT:     Head: Normocephalic.  Eyes:     Extraocular Movements: Extraocular movements intact.  Pulmonary:     Effort: Pulmonary effort is normal.  Skin:    Comments: 2 cm laceration to the radial aspect of the right elbow   Neurological:     Mental Status: He is alert and oriented to person, place, and time. Mental status is at baseline.  Psychiatric:        Mood and Affect: Mood normal.        Behavior: Behavior normal.     UC Treatments / Results  Labs (all labs ordered are listed, but only abnormal results are displayed) Labs Reviewed - No data to display  EKG   Radiology No results found.  Procedures Laceration Repair  Date/Time: 03/31/2021 5:02 PM Performed by: Hans Eden, NP Authorized by: Hans Eden, NP   Consent:    Consent obtained:  Verbal   Consent given by:  Patient   Risks, benefits, and alternatives were discussed: yes     Risks discussed:  Poor cosmetic result   Alternatives discussed:  No treatment Universal protocol:    Procedure explained and questions answered  to patient or proxy's satisfaction: yes     Patient identity confirmed:  Verbally with patient Anesthesia:    Anesthesia method:  None Laceration details:    Location:  Shoulder/arm   Shoulder/arm location:  L elbow   Length (cm):  2 Treatment:    Area cleansed with:  Povidone-iodine   Amount of cleaning:  Standard   Irrigation solution:  Sterile saline   Irrigation volume:  10   Debridement:  None Skin repair:    Repair method:  Tissue adhesive Approximation:    Approximation:  Close Repair type:    Repair type:  Simple Post-procedure details:    Dressing:  Open (no dressing)   Procedure completion:  Tolerated (including critical care time)  Medications Ordered in UC Medications  Tdap (BOOSTRIX) injection 0.5 mL (0.5 mLs Intramuscular Given 03/31/21 1549)    Initial Impression / Assessment and Plan / UC Course  I have reviewed the triage vital signs and the nursing notes.  Pertinent labs & imaging results that were available during my care of the patient were reviewed by me and considered in my medical decision making (see chart for details).  Elbow laceration, right, initial encounter  Site able to be closed with skin glue, and he will without complication however given strict precautions to monitor for signs of infection and to return urgent care if any occur for reevaluation, not currently having pain, may use over-the-counter Tylenol or ibuprofen as needed if symptom occurs, Tdap booster given Final Clinical Impressions(s) / UC Diagnoses   Final diagnoses:  Elbow laceration, right, initial encounter     Discharge Instructions      Laceration has been closed with skin glue today, should heal on its own without any complications  Please watch for signs of infection such as increased pain, increased swelling, redness, drainage, fever or chills, if these occur at any point please return urgent care for reevaluation  Until area has completely healed you may cover it  as needed if there is a possibility that it may become contaminated   ED Prescriptions  None    PDMP not reviewed this encounter.   Hans Eden, NP 03/31/21 1715    Hans Eden, NP 03/31/21 [phone]

## 2021-03-31 NOTE — Discharge Instructions (Signed)
Laceration has been closed with skin glue today, should heal on its own without any complications  Please watch for signs of infection such as increased pain, increased swelling, redness, drainage, fever or chills, if these occur at any point please return urgent care for reevaluation  Until area has completely healed you may cover it as needed if there is a possibility that it may become contaminated

## 2021-03-31 NOTE — ED Triage Notes (Signed)
Pt has laceration on his right elbow he states he cut it on a piece of glass about an hour ago. Last tetanus 12/12/2016
